# Patient Record
Sex: Male | Born: 1935 | Race: White | Hispanic: No | Marital: Married
Health system: Southern US, Community
[De-identification: ages and names within clinical notes are randomized; demographics above are authoritative.]

## PROBLEM LIST (undated history)

## (undated) DIAGNOSIS — F0390 Unspecified dementia without behavioral disturbance: Secondary | ICD-10-CM

---

## 1998-08-18 ENCOUNTER — Encounter: Payer: Self-pay | Admitting: Gastroenterology

## 1998-08-18 ENCOUNTER — Inpatient Hospital Stay (HOSPITAL_COMMUNITY): Admission: RE | Admit: 1998-08-18 | Discharge: 1998-08-25 | Payer: Self-pay | Admitting: Gastroenterology

## 1999-10-19 ENCOUNTER — Ambulatory Visit (HOSPITAL_COMMUNITY): Admission: RE | Admit: 1999-10-19 | Discharge: 1999-10-19 | Payer: Self-pay | Admitting: Gastroenterology

## 2002-11-13 ENCOUNTER — Encounter (INDEPENDENT_AMBULATORY_CARE_PROVIDER_SITE_OTHER): Payer: Self-pay | Admitting: Specialist

## 2002-11-13 ENCOUNTER — Ambulatory Visit (HOSPITAL_COMMUNITY): Admission: RE | Admit: 2002-11-13 | Discharge: 2002-11-13 | Payer: Self-pay | Admitting: Gastroenterology

## 2010-03-23 ENCOUNTER — Emergency Department (HOSPITAL_BASED_OUTPATIENT_CLINIC_OR_DEPARTMENT_OTHER)
Admission: EM | Admit: 2010-03-23 | Discharge: 2010-03-23 | Payer: Self-pay | Source: Home / Self Care | Admitting: Emergency Medicine

## 2010-08-07 NOTE — Procedures (Signed)
[facility]  Patient:    Frank Romero, Frank Romero                  MRN: [id number] Proc. Date: 10/19/99 Adm. Date:  [id number] Attending:  Louie Bun CC:         Abran Cantor. Clovis Riley, MD             Sheppard Plumber Earlene Plater, M.D.                           Procedure Report  PROCEDURE PERFORMED:  Colonoscopy.  ENDOSCOPIST:  Everardo All. Madilyn Fireman, M.D.  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  DESCRIPTION OF PROCEDURE:  The patient was placed in the left lateral decubitus position and placed on the pulse monitor and continuous low flow oxygen delivered by nasal cannula.  He was sedated with 40 mg IV Demerol and 3 mg IV Versed.  The Olympus video colonoscope was inserted into the rectum and advanced to the ileocolonic anastomosis which was easily identifiable by fibrosis at the anastomotic site.  There was no visible suspicion of neoplasm at the anastomosis and no biopsies were taken.  The colon distal to the anastomosis including the remaining transverse, descending, sigmoid and rectum all appeared normal with no masses, polyps, diverticula or other mucosal abnormalities.  There were some internal and external hemorrhoids seen on withdrawal.  The colonoscope was withdrawn and the patient returned to the recovery room in stable condition.  The patient tolerated the procedure well. There were no immediate complications.  IMPRESSION: 1. Intact anastomosis without evidence of recurrent disease one year after    hemicolectomy for colon cancer. 2. Internal and external hemorrhoids.  PLAN:  Repeat colonoscopy in three years. DD:  10/19/99 TD:  10/20/99 Job: [id number]

## 2010-08-07 NOTE — Op Note (Signed)
   NAME:  Frank Romero, Frank Romero                     ACCOUNT NO.:  [id number]   MEDICAL RECORD NO.:  [id number]                   PATIENT TYPE:  AMB   LOCATION:  ENDO                                 FACILITY:  [facility]   PHYSICIAN:  John C. Madilyn Fireman, M.D.                 DATE OF BIRTH:  Aug 28, 1923   DATE OF PROCEDURE:  11/13/2002  DATE OF DISCHARGE:                                 OPERATIVE REPORT   PROCEDURE:  Colonoscopy polypectomy.   INDICATIONS FOR PROCEDURE:  History of colon cancer with right hemicolectomy  in 2000.  Negative surveillance colonoscopy in 2001.   DESCRIPTION OF PROCEDURE:  The patient was placed in the left lateral  decubitus position and placed on the pulse monitor with continuous low-flow  oxygen delivered by nasal cannula.  He was sedated with 62.5 mcg IV  fentanyl, 5  mg IV Versed.  The Olympus video colonoscope was inserted into  the rectum and advanced to the ileocolonic anastomosis which was easily  identifiable.  There was no visible suspicion of neoplasm there.  The  remaining proximal colon including the visualized portions of the transverse  and descending colon appeared normal with no masses, polyps, diverticula, or  other mucosal abnormalities.  There was a questionable 6 mm polyp in the  sigmoid colon at 30 cm and this was fulgurated by hot biopsy.  The rectum  appeared normal and retroflexed view of the anus revealed no obvious  internal hemorrhoids.  The colonoscope was then withdrawn and the patient  returned to the recovery room in stable condition.  He tolerated the  procedure well and there were no immediate complications.   IMPRESSION:  Questionable small sigmoid polyp.   PLAN:  Await biopsy results and repeat colonoscopy in three to five years.                                                John C. Madilyn Fireman, M.D.    JCH/MEDQ  D:  11/13/2002  T:  11/13/2002  Job:  [id number]   cc:   L. Lupe Carney, M.D.  [address] [place]  [place]  [address]  Fax: [phone]   Sheppard Plumber. Earlene Plater, M.D.  [address] [place]  [place] [address]  Fax: [phone]

## 2013-04-06 DIAGNOSIS — I679 Cerebrovascular disease, unspecified: Secondary | ICD-10-CM | POA: Diagnosis not present

## 2013-04-06 DIAGNOSIS — I10 Essential (primary) hypertension: Secondary | ICD-10-CM | POA: Diagnosis not present

## 2013-04-06 DIAGNOSIS — E7800 Pure hypercholesterolemia, unspecified: Secondary | ICD-10-CM | POA: Diagnosis not present

## 2013-04-17 DIAGNOSIS — R269 Unspecified abnormalities of gait and mobility: Secondary | ICD-10-CM | POA: Diagnosis not present

## 2013-04-17 DIAGNOSIS — I679 Cerebrovascular disease, unspecified: Secondary | ICD-10-CM | POA: Diagnosis not present

## 2013-04-18 DIAGNOSIS — R269 Unspecified abnormalities of gait and mobility: Secondary | ICD-10-CM | POA: Diagnosis not present

## 2013-04-18 DIAGNOSIS — I679 Cerebrovascular disease, unspecified: Secondary | ICD-10-CM | POA: Diagnosis not present

## 2013-04-23 DIAGNOSIS — I679 Cerebrovascular disease, unspecified: Secondary | ICD-10-CM | POA: Diagnosis not present

## 2013-04-23 DIAGNOSIS — R269 Unspecified abnormalities of gait and mobility: Secondary | ICD-10-CM | POA: Diagnosis not present

## 2013-04-25 DIAGNOSIS — R269 Unspecified abnormalities of gait and mobility: Secondary | ICD-10-CM | POA: Diagnosis not present

## 2013-04-25 DIAGNOSIS — I679 Cerebrovascular disease, unspecified: Secondary | ICD-10-CM | POA: Diagnosis not present

## 2013-04-27 DIAGNOSIS — R269 Unspecified abnormalities of gait and mobility: Secondary | ICD-10-CM | POA: Diagnosis not present

## 2013-04-27 DIAGNOSIS — I679 Cerebrovascular disease, unspecified: Secondary | ICD-10-CM | POA: Diagnosis not present

## 2013-04-30 DIAGNOSIS — I679 Cerebrovascular disease, unspecified: Secondary | ICD-10-CM | POA: Diagnosis not present

## 2013-04-30 DIAGNOSIS — R269 Unspecified abnormalities of gait and mobility: Secondary | ICD-10-CM | POA: Diagnosis not present

## 2013-05-02 DIAGNOSIS — R269 Unspecified abnormalities of gait and mobility: Secondary | ICD-10-CM | POA: Diagnosis not present

## 2013-05-02 DIAGNOSIS — I679 Cerebrovascular disease, unspecified: Secondary | ICD-10-CM | POA: Diagnosis not present

## 2013-05-06 DIAGNOSIS — I679 Cerebrovascular disease, unspecified: Secondary | ICD-10-CM | POA: Diagnosis not present

## 2013-05-06 DIAGNOSIS — R269 Unspecified abnormalities of gait and mobility: Secondary | ICD-10-CM | POA: Diagnosis not present

## 2013-05-07 DIAGNOSIS — I679 Cerebrovascular disease, unspecified: Secondary | ICD-10-CM | POA: Diagnosis not present

## 2013-05-07 DIAGNOSIS — R269 Unspecified abnormalities of gait and mobility: Secondary | ICD-10-CM | POA: Diagnosis not present

## 2013-05-09 DIAGNOSIS — R269 Unspecified abnormalities of gait and mobility: Secondary | ICD-10-CM | POA: Diagnosis not present

## 2013-05-09 DIAGNOSIS — I679 Cerebrovascular disease, unspecified: Secondary | ICD-10-CM | POA: Diagnosis not present

## 2013-05-11 DIAGNOSIS — R269 Unspecified abnormalities of gait and mobility: Secondary | ICD-10-CM | POA: Diagnosis not present

## 2013-05-11 DIAGNOSIS — I679 Cerebrovascular disease, unspecified: Secondary | ICD-10-CM | POA: Diagnosis not present

## 2013-06-01 DIAGNOSIS — H4011X0 Primary open-angle glaucoma, stage unspecified: Secondary | ICD-10-CM | POA: Diagnosis not present

## 2013-06-01 DIAGNOSIS — H04129 Dry eye syndrome of unspecified lacrimal gland: Secondary | ICD-10-CM | POA: Diagnosis not present

## 2013-06-01 DIAGNOSIS — H2510 Age-related nuclear cataract, unspecified eye: Secondary | ICD-10-CM | POA: Diagnosis not present

## 2013-10-04 DIAGNOSIS — E7800 Pure hypercholesterolemia, unspecified: Secondary | ICD-10-CM | POA: Diagnosis not present

## 2013-10-04 DIAGNOSIS — I10 Essential (primary) hypertension: Secondary | ICD-10-CM | POA: Diagnosis not present

## 2013-10-04 DIAGNOSIS — Z23 Encounter for immunization: Secondary | ICD-10-CM | POA: Diagnosis not present

## 2013-10-04 DIAGNOSIS — I679 Cerebrovascular disease, unspecified: Secondary | ICD-10-CM | POA: Diagnosis not present

## 2013-10-11 DIAGNOSIS — H04129 Dry eye syndrome of unspecified lacrimal gland: Secondary | ICD-10-CM | POA: Diagnosis not present

## 2013-10-11 DIAGNOSIS — H4011X0 Primary open-angle glaucoma, stage unspecified: Secondary | ICD-10-CM | POA: Diagnosis not present

## 2013-10-11 DIAGNOSIS — H01029 Squamous blepharitis unspecified eye, unspecified eyelid: Secondary | ICD-10-CM | POA: Diagnosis not present

## 2013-10-11 DIAGNOSIS — H2510 Age-related nuclear cataract, unspecified eye: Secondary | ICD-10-CM | POA: Diagnosis not present

## 2013-12-20 DIAGNOSIS — Z23 Encounter for immunization: Secondary | ICD-10-CM | POA: Diagnosis not present

## 2014-02-07 DIAGNOSIS — H01025 Squamous blepharitis left lower eyelid: Secondary | ICD-10-CM | POA: Diagnosis not present

## 2014-02-07 DIAGNOSIS — H01021 Squamous blepharitis right upper eyelid: Secondary | ICD-10-CM | POA: Diagnosis not present

## 2014-02-07 DIAGNOSIS — H01024 Squamous blepharitis left upper eyelid: Secondary | ICD-10-CM | POA: Diagnosis not present

## 2014-02-07 DIAGNOSIS — H01022 Squamous blepharitis right lower eyelid: Secondary | ICD-10-CM | POA: Diagnosis not present

## 2014-02-07 DIAGNOSIS — H4011X3 Primary open-angle glaucoma, severe stage: Secondary | ICD-10-CM | POA: Diagnosis not present

## 2014-02-07 DIAGNOSIS — H2513 Age-related nuclear cataract, bilateral: Secondary | ICD-10-CM | POA: Diagnosis not present

## 2014-04-09 DIAGNOSIS — I679 Cerebrovascular disease, unspecified: Secondary | ICD-10-CM | POA: Diagnosis not present

## 2014-04-09 DIAGNOSIS — E780 Pure hypercholesterolemia: Secondary | ICD-10-CM | POA: Diagnosis not present

## 2014-04-09 DIAGNOSIS — Z85038 Personal history of other malignant neoplasm of large intestine: Secondary | ICD-10-CM | POA: Diagnosis not present

## 2014-04-09 DIAGNOSIS — K649 Unspecified hemorrhoids: Secondary | ICD-10-CM | POA: Diagnosis not present

## 2014-04-09 DIAGNOSIS — R413 Other amnesia: Secondary | ICD-10-CM | POA: Diagnosis not present

## 2014-04-09 DIAGNOSIS — I10 Essential (primary) hypertension: Secondary | ICD-10-CM | POA: Diagnosis not present

## 2014-06-11 DIAGNOSIS — H4011X3 Primary open-angle glaucoma, severe stage: Secondary | ICD-10-CM | POA: Diagnosis not present

## 2014-06-11 DIAGNOSIS — H2513 Age-related nuclear cataract, bilateral: Secondary | ICD-10-CM | POA: Diagnosis not present

## 2014-06-11 DIAGNOSIS — H5703 Miosis: Secondary | ICD-10-CM | POA: Diagnosis not present

## 2014-06-11 DIAGNOSIS — H4011X1 Primary open-angle glaucoma, mild stage: Secondary | ICD-10-CM | POA: Diagnosis not present

## 2014-07-01 DIAGNOSIS — H5703 Miosis: Secondary | ICD-10-CM | POA: Diagnosis not present

## 2014-07-01 DIAGNOSIS — H268 Other specified cataract: Secondary | ICD-10-CM | POA: Diagnosis not present

## 2014-07-01 DIAGNOSIS — H2512 Age-related nuclear cataract, left eye: Secondary | ICD-10-CM | POA: Diagnosis not present

## 2014-07-01 DIAGNOSIS — H21562 Pupillary abnormality, left eye: Secondary | ICD-10-CM | POA: Diagnosis not present

## 2014-07-01 DIAGNOSIS — H409 Unspecified glaucoma: Secondary | ICD-10-CM | POA: Diagnosis not present

## 2014-10-03 DIAGNOSIS — E780 Pure hypercholesterolemia: Secondary | ICD-10-CM | POA: Diagnosis not present

## 2014-10-03 DIAGNOSIS — I679 Cerebrovascular disease, unspecified: Secondary | ICD-10-CM | POA: Diagnosis not present

## 2014-10-03 DIAGNOSIS — R413 Other amnesia: Secondary | ICD-10-CM | POA: Diagnosis not present

## 2014-10-03 DIAGNOSIS — I10 Essential (primary) hypertension: Secondary | ICD-10-CM | POA: Diagnosis not present

## 2014-11-07 DIAGNOSIS — H4011X1 Primary open-angle glaucoma, mild stage: Secondary | ICD-10-CM | POA: Diagnosis not present

## 2014-11-07 DIAGNOSIS — H4011X3 Primary open-angle glaucoma, severe stage: Secondary | ICD-10-CM | POA: Diagnosis not present

## 2014-11-07 DIAGNOSIS — Z961 Presence of intraocular lens: Secondary | ICD-10-CM | POA: Diagnosis not present

## 2014-11-07 DIAGNOSIS — H25811 Combined forms of age-related cataract, right eye: Secondary | ICD-10-CM | POA: Diagnosis not present

## 2015-03-18 DIAGNOSIS — Z961 Presence of intraocular lens: Secondary | ICD-10-CM | POA: Diagnosis not present

## 2015-03-18 DIAGNOSIS — H401111 Primary open-angle glaucoma, right eye, mild stage: Secondary | ICD-10-CM | POA: Diagnosis not present

## 2015-03-18 DIAGNOSIS — H25811 Combined forms of age-related cataract, right eye: Secondary | ICD-10-CM | POA: Diagnosis not present

## 2015-03-18 DIAGNOSIS — H401123 Primary open-angle glaucoma, left eye, severe stage: Secondary | ICD-10-CM | POA: Diagnosis not present

## 2015-04-08 DIAGNOSIS — E7800 Pure hypercholesterolemia, unspecified: Secondary | ICD-10-CM | POA: Diagnosis not present

## 2015-04-08 DIAGNOSIS — I679 Cerebrovascular disease, unspecified: Secondary | ICD-10-CM | POA: Diagnosis not present

## 2015-04-08 DIAGNOSIS — I10 Essential (primary) hypertension: Secondary | ICD-10-CM | POA: Diagnosis not present

## 2015-04-08 DIAGNOSIS — R413 Other amnesia: Secondary | ICD-10-CM | POA: Diagnosis not present

## 2015-06-05 DIAGNOSIS — H25811 Combined forms of age-related cataract, right eye: Secondary | ICD-10-CM | POA: Diagnosis not present

## 2015-06-05 DIAGNOSIS — Z961 Presence of intraocular lens: Secondary | ICD-10-CM | POA: Diagnosis not present

## 2015-06-05 DIAGNOSIS — H401123 Primary open-angle glaucoma, left eye, severe stage: Secondary | ICD-10-CM | POA: Diagnosis not present

## 2015-06-05 DIAGNOSIS — H401111 Primary open-angle glaucoma, right eye, mild stage: Secondary | ICD-10-CM | POA: Diagnosis not present

## 2015-10-07 DIAGNOSIS — R413 Other amnesia: Secondary | ICD-10-CM | POA: Diagnosis not present

## 2015-10-07 DIAGNOSIS — I10 Essential (primary) hypertension: Secondary | ICD-10-CM | POA: Diagnosis not present

## 2015-10-07 DIAGNOSIS — E7800 Pure hypercholesterolemia, unspecified: Secondary | ICD-10-CM | POA: Diagnosis not present

## 2015-10-07 DIAGNOSIS — D489 Neoplasm of uncertain behavior, unspecified: Secondary | ICD-10-CM | POA: Diagnosis not present

## 2015-10-07 DIAGNOSIS — I679 Cerebrovascular disease, unspecified: Secondary | ICD-10-CM | POA: Diagnosis not present

## 2015-10-07 DIAGNOSIS — K649 Unspecified hemorrhoids: Secondary | ICD-10-CM | POA: Diagnosis not present

## 2015-10-07 DIAGNOSIS — D485 Neoplasm of uncertain behavior of skin: Secondary | ICD-10-CM | POA: Diagnosis not present

## 2015-10-09 DIAGNOSIS — H25811 Combined forms of age-related cataract, right eye: Secondary | ICD-10-CM | POA: Diagnosis not present

## 2015-10-09 DIAGNOSIS — H401111 Primary open-angle glaucoma, right eye, mild stage: Secondary | ICD-10-CM | POA: Diagnosis not present

## 2015-10-09 DIAGNOSIS — H401123 Primary open-angle glaucoma, left eye, severe stage: Secondary | ICD-10-CM | POA: Diagnosis not present

## 2015-10-09 DIAGNOSIS — Z961 Presence of intraocular lens: Secondary | ICD-10-CM | POA: Diagnosis not present

## 2015-11-21 DIAGNOSIS — H2511 Age-related nuclear cataract, right eye: Secondary | ICD-10-CM | POA: Diagnosis not present

## 2015-12-16 DIAGNOSIS — Z23 Encounter for immunization: Secondary | ICD-10-CM | POA: Diagnosis not present

## 2016-01-05 DIAGNOSIS — H2511 Age-related nuclear cataract, right eye: Secondary | ICD-10-CM | POA: Diagnosis not present

## 2016-01-05 DIAGNOSIS — H401112 Primary open-angle glaucoma, right eye, moderate stage: Secondary | ICD-10-CM | POA: Diagnosis not present

## 2016-01-05 DIAGNOSIS — H401111 Primary open-angle glaucoma, right eye, mild stage: Secondary | ICD-10-CM | POA: Diagnosis not present

## 2016-01-05 DIAGNOSIS — H25011 Cortical age-related cataract, right eye: Secondary | ICD-10-CM | POA: Diagnosis not present

## 2016-01-05 DIAGNOSIS — H25041 Posterior subcapsular polar age-related cataract, right eye: Secondary | ICD-10-CM | POA: Diagnosis not present

## 2016-05-07 DIAGNOSIS — R413 Other amnesia: Secondary | ICD-10-CM | POA: Diagnosis not present

## 2016-05-07 DIAGNOSIS — E7800 Pure hypercholesterolemia, unspecified: Secondary | ICD-10-CM | POA: Diagnosis not present

## 2016-05-07 DIAGNOSIS — I10 Essential (primary) hypertension: Secondary | ICD-10-CM | POA: Diagnosis not present

## 2016-05-07 DIAGNOSIS — I679 Cerebrovascular disease, unspecified: Secondary | ICD-10-CM | POA: Diagnosis not present

## 2016-07-01 DIAGNOSIS — H25811 Combined forms of age-related cataract, right eye: Secondary | ICD-10-CM | POA: Diagnosis not present

## 2016-07-01 DIAGNOSIS — Z961 Presence of intraocular lens: Secondary | ICD-10-CM | POA: Diagnosis not present

## 2016-07-01 DIAGNOSIS — H401111 Primary open-angle glaucoma, right eye, mild stage: Secondary | ICD-10-CM | POA: Diagnosis not present

## 2016-07-01 DIAGNOSIS — H401123 Primary open-angle glaucoma, left eye, severe stage: Secondary | ICD-10-CM | POA: Diagnosis not present

## 2016-10-29 DIAGNOSIS — H401123 Primary open-angle glaucoma, left eye, severe stage: Secondary | ICD-10-CM | POA: Diagnosis not present

## 2016-10-29 DIAGNOSIS — H401111 Primary open-angle glaucoma, right eye, mild stage: Secondary | ICD-10-CM | POA: Diagnosis not present

## 2016-11-09 DIAGNOSIS — I679 Cerebrovascular disease, unspecified: Secondary | ICD-10-CM | POA: Diagnosis not present

## 2016-11-09 DIAGNOSIS — I10 Essential (primary) hypertension: Secondary | ICD-10-CM | POA: Diagnosis not present

## 2016-11-09 DIAGNOSIS — E7800 Pure hypercholesterolemia, unspecified: Secondary | ICD-10-CM | POA: Diagnosis not present

## 2016-11-09 DIAGNOSIS — K649 Unspecified hemorrhoids: Secondary | ICD-10-CM | POA: Diagnosis not present

## 2016-11-09 DIAGNOSIS — R413 Other amnesia: Secondary | ICD-10-CM | POA: Diagnosis not present

## 2016-11-19 DIAGNOSIS — H401123 Primary open-angle glaucoma, left eye, severe stage: Secondary | ICD-10-CM | POA: Diagnosis not present

## 2016-11-19 DIAGNOSIS — H401111 Primary open-angle glaucoma, right eye, mild stage: Secondary | ICD-10-CM | POA: Diagnosis not present

## 2016-12-22 DIAGNOSIS — Z23 Encounter for immunization: Secondary | ICD-10-CM | POA: Diagnosis not present

## 2017-03-31 DIAGNOSIS — H401112 Primary open-angle glaucoma, right eye, moderate stage: Secondary | ICD-10-CM | POA: Diagnosis not present

## 2017-03-31 DIAGNOSIS — Z961 Presence of intraocular lens: Secondary | ICD-10-CM | POA: Diagnosis not present

## 2017-03-31 DIAGNOSIS — H401123 Primary open-angle glaucoma, left eye, severe stage: Secondary | ICD-10-CM | POA: Diagnosis not present

## 2017-05-17 DIAGNOSIS — E7800 Pure hypercholesterolemia, unspecified: Secondary | ICD-10-CM | POA: Diagnosis not present

## 2017-05-17 DIAGNOSIS — R413 Other amnesia: Secondary | ICD-10-CM | POA: Diagnosis not present

## 2017-05-17 DIAGNOSIS — I10 Essential (primary) hypertension: Secondary | ICD-10-CM | POA: Diagnosis not present

## 2017-05-17 DIAGNOSIS — I679 Cerebrovascular disease, unspecified: Secondary | ICD-10-CM | POA: Diagnosis not present

## 2017-06-07 DIAGNOSIS — H401112 Primary open-angle glaucoma, right eye, moderate stage: Secondary | ICD-10-CM | POA: Diagnosis not present

## 2017-06-07 DIAGNOSIS — H401123 Primary open-angle glaucoma, left eye, severe stage: Secondary | ICD-10-CM | POA: Diagnosis not present

## 2017-11-15 DIAGNOSIS — I10 Essential (primary) hypertension: Secondary | ICD-10-CM | POA: Diagnosis not present

## 2017-11-15 DIAGNOSIS — R413 Other amnesia: Secondary | ICD-10-CM | POA: Diagnosis not present

## 2017-11-15 DIAGNOSIS — I679 Cerebrovascular disease, unspecified: Secondary | ICD-10-CM | POA: Diagnosis not present

## 2017-11-15 DIAGNOSIS — E7800 Pure hypercholesterolemia, unspecified: Secondary | ICD-10-CM | POA: Diagnosis not present

## 2017-12-13 DIAGNOSIS — H401111 Primary open-angle glaucoma, right eye, mild stage: Secondary | ICD-10-CM | POA: Diagnosis not present

## 2017-12-13 DIAGNOSIS — H401123 Primary open-angle glaucoma, left eye, severe stage: Secondary | ICD-10-CM | POA: Diagnosis not present

## 2017-12-15 DIAGNOSIS — Z23 Encounter for immunization: Secondary | ICD-10-CM | POA: Diagnosis not present

## 2018-05-24 DIAGNOSIS — E7800 Pure hypercholesterolemia, unspecified: Secondary | ICD-10-CM | POA: Diagnosis not present

## 2018-05-24 DIAGNOSIS — I10 Essential (primary) hypertension: Secondary | ICD-10-CM | POA: Diagnosis not present

## 2018-05-24 DIAGNOSIS — I679 Cerebrovascular disease, unspecified: Secondary | ICD-10-CM | POA: Diagnosis not present

## 2018-05-24 DIAGNOSIS — K649 Unspecified hemorrhoids: Secondary | ICD-10-CM | POA: Diagnosis not present

## 2018-05-24 DIAGNOSIS — R413 Other amnesia: Secondary | ICD-10-CM | POA: Diagnosis not present

## 2018-07-13 DIAGNOSIS — H4010X0 Unspecified open-angle glaucoma, stage unspecified: Secondary | ICD-10-CM | POA: Diagnosis not present

## 2018-07-13 DIAGNOSIS — I10 Essential (primary) hypertension: Secondary | ICD-10-CM | POA: Diagnosis not present

## 2018-07-13 DIAGNOSIS — E785 Hyperlipidemia, unspecified: Secondary | ICD-10-CM | POA: Diagnosis not present

## 2018-07-18 DIAGNOSIS — I10 Essential (primary) hypertension: Secondary | ICD-10-CM | POA: Diagnosis not present

## 2018-07-18 DIAGNOSIS — H409 Unspecified glaucoma: Secondary | ICD-10-CM | POA: Diagnosis not present

## 2018-07-18 DIAGNOSIS — E785 Hyperlipidemia, unspecified: Secondary | ICD-10-CM | POA: Diagnosis not present

## 2018-07-18 DIAGNOSIS — F0390 Unspecified dementia without behavioral disturbance: Secondary | ICD-10-CM | POA: Diagnosis not present

## 2018-08-22 DIAGNOSIS — W182XXA Fall in (into) shower or empty bathtub, initial encounter: Secondary | ICD-10-CM | POA: Diagnosis not present

## 2018-08-22 DIAGNOSIS — S0083XA Contusion of other part of head, initial encounter: Secondary | ICD-10-CM | POA: Diagnosis not present

## 2018-09-12 DIAGNOSIS — R2681 Unsteadiness on feet: Secondary | ICD-10-CM | POA: Diagnosis not present

## 2018-09-12 DIAGNOSIS — Z9181 History of falling: Secondary | ICD-10-CM | POA: Diagnosis not present

## 2018-09-12 DIAGNOSIS — I69398 Other sequelae of cerebral infarction: Secondary | ICD-10-CM | POA: Diagnosis not present

## 2018-09-13 DIAGNOSIS — Z9181 History of falling: Secondary | ICD-10-CM | POA: Diagnosis not present

## 2018-09-13 DIAGNOSIS — R2681 Unsteadiness on feet: Secondary | ICD-10-CM | POA: Diagnosis not present

## 2018-09-13 DIAGNOSIS — I69398 Other sequelae of cerebral infarction: Secondary | ICD-10-CM | POA: Diagnosis not present

## 2018-09-18 DIAGNOSIS — I69398 Other sequelae of cerebral infarction: Secondary | ICD-10-CM | POA: Diagnosis not present

## 2018-09-18 DIAGNOSIS — Z9181 History of falling: Secondary | ICD-10-CM | POA: Diagnosis not present

## 2018-09-18 DIAGNOSIS — R2681 Unsteadiness on feet: Secondary | ICD-10-CM | POA: Diagnosis not present

## 2018-09-20 DIAGNOSIS — R2681 Unsteadiness on feet: Secondary | ICD-10-CM | POA: Diagnosis not present

## 2018-09-20 DIAGNOSIS — Z9181 History of falling: Secondary | ICD-10-CM | POA: Diagnosis not present

## 2018-09-20 DIAGNOSIS — I69398 Other sequelae of cerebral infarction: Secondary | ICD-10-CM | POA: Diagnosis not present

## 2018-09-21 DIAGNOSIS — I69398 Other sequelae of cerebral infarction: Secondary | ICD-10-CM | POA: Diagnosis not present

## 2018-09-21 DIAGNOSIS — R2681 Unsteadiness on feet: Secondary | ICD-10-CM | POA: Diagnosis not present

## 2018-09-21 DIAGNOSIS — Z9181 History of falling: Secondary | ICD-10-CM | POA: Diagnosis not present

## 2018-09-25 DIAGNOSIS — I69398 Other sequelae of cerebral infarction: Secondary | ICD-10-CM | POA: Diagnosis not present

## 2018-09-25 DIAGNOSIS — Z9181 History of falling: Secondary | ICD-10-CM | POA: Diagnosis not present

## 2018-09-25 DIAGNOSIS — R2681 Unsteadiness on feet: Secondary | ICD-10-CM | POA: Diagnosis not present

## 2018-09-27 DIAGNOSIS — R2681 Unsteadiness on feet: Secondary | ICD-10-CM | POA: Diagnosis not present

## 2018-09-27 DIAGNOSIS — I69398 Other sequelae of cerebral infarction: Secondary | ICD-10-CM | POA: Diagnosis not present

## 2018-09-27 DIAGNOSIS — Z9181 History of falling: Secondary | ICD-10-CM | POA: Diagnosis not present

## 2018-09-28 DIAGNOSIS — I69398 Other sequelae of cerebral infarction: Secondary | ICD-10-CM | POA: Diagnosis not present

## 2018-09-28 DIAGNOSIS — Z9181 History of falling: Secondary | ICD-10-CM | POA: Diagnosis not present

## 2018-09-28 DIAGNOSIS — R2681 Unsteadiness on feet: Secondary | ICD-10-CM | POA: Diagnosis not present

## 2018-10-02 DIAGNOSIS — I69398 Other sequelae of cerebral infarction: Secondary | ICD-10-CM | POA: Diagnosis not present

## 2018-10-02 DIAGNOSIS — Z9181 History of falling: Secondary | ICD-10-CM | POA: Diagnosis not present

## 2018-10-02 DIAGNOSIS — R2681 Unsteadiness on feet: Secondary | ICD-10-CM | POA: Diagnosis not present

## 2018-10-04 DIAGNOSIS — Z9181 History of falling: Secondary | ICD-10-CM | POA: Diagnosis not present

## 2018-10-04 DIAGNOSIS — I69398 Other sequelae of cerebral infarction: Secondary | ICD-10-CM | POA: Diagnosis not present

## 2018-10-04 DIAGNOSIS — R2681 Unsteadiness on feet: Secondary | ICD-10-CM | POA: Diagnosis not present

## 2018-10-05 DIAGNOSIS — R2681 Unsteadiness on feet: Secondary | ICD-10-CM | POA: Diagnosis not present

## 2018-10-05 DIAGNOSIS — I69398 Other sequelae of cerebral infarction: Secondary | ICD-10-CM | POA: Diagnosis not present

## 2018-10-05 DIAGNOSIS — Z9181 History of falling: Secondary | ICD-10-CM | POA: Diagnosis not present

## 2018-10-09 DIAGNOSIS — Z9181 History of falling: Secondary | ICD-10-CM | POA: Diagnosis not present

## 2018-10-09 DIAGNOSIS — I69398 Other sequelae of cerebral infarction: Secondary | ICD-10-CM | POA: Diagnosis not present

## 2018-10-09 DIAGNOSIS — R2681 Unsteadiness on feet: Secondary | ICD-10-CM | POA: Diagnosis not present

## 2018-10-11 DIAGNOSIS — Z9181 History of falling: Secondary | ICD-10-CM | POA: Diagnosis not present

## 2018-10-11 DIAGNOSIS — I69398 Other sequelae of cerebral infarction: Secondary | ICD-10-CM | POA: Diagnosis not present

## 2018-10-11 DIAGNOSIS — R2681 Unsteadiness on feet: Secondary | ICD-10-CM | POA: Diagnosis not present

## 2018-10-12 DIAGNOSIS — Z9181 History of falling: Secondary | ICD-10-CM | POA: Diagnosis not present

## 2018-10-12 DIAGNOSIS — I69398 Other sequelae of cerebral infarction: Secondary | ICD-10-CM | POA: Diagnosis not present

## 2018-10-12 DIAGNOSIS — R2681 Unsteadiness on feet: Secondary | ICD-10-CM | POA: Diagnosis not present

## 2018-10-16 DIAGNOSIS — R2681 Unsteadiness on feet: Secondary | ICD-10-CM | POA: Diagnosis not present

## 2018-10-16 DIAGNOSIS — I69398 Other sequelae of cerebral infarction: Secondary | ICD-10-CM | POA: Diagnosis not present

## 2018-10-16 DIAGNOSIS — Z9181 History of falling: Secondary | ICD-10-CM | POA: Diagnosis not present

## 2018-11-02 DIAGNOSIS — Z961 Presence of intraocular lens: Secondary | ICD-10-CM | POA: Diagnosis not present

## 2018-11-02 DIAGNOSIS — H401123 Primary open-angle glaucoma, left eye, severe stage: Secondary | ICD-10-CM | POA: Diagnosis not present

## 2018-11-02 DIAGNOSIS — H401111 Primary open-angle glaucoma, right eye, mild stage: Secondary | ICD-10-CM | POA: Diagnosis not present

## 2018-11-29 DIAGNOSIS — E7800 Pure hypercholesterolemia, unspecified: Secondary | ICD-10-CM | POA: Diagnosis not present

## 2018-11-29 DIAGNOSIS — K649 Unspecified hemorrhoids: Secondary | ICD-10-CM | POA: Diagnosis not present

## 2018-11-29 DIAGNOSIS — R413 Other amnesia: Secondary | ICD-10-CM | POA: Diagnosis not present

## 2018-11-29 DIAGNOSIS — I679 Cerebrovascular disease, unspecified: Secondary | ICD-10-CM | POA: Diagnosis not present

## 2018-11-29 DIAGNOSIS — I10 Essential (primary) hypertension: Secondary | ICD-10-CM | POA: Diagnosis not present

## 2019-04-04 DIAGNOSIS — Z23 Encounter for immunization: Secondary | ICD-10-CM | POA: Diagnosis not present

## 2019-04-18 DIAGNOSIS — H26492 Other secondary cataract, left eye: Secondary | ICD-10-CM | POA: Diagnosis not present

## 2019-05-01 DIAGNOSIS — Z23 Encounter for immunization: Secondary | ICD-10-CM | POA: Diagnosis not present

## 2019-06-06 DIAGNOSIS — R413 Other amnesia: Secondary | ICD-10-CM | POA: Diagnosis not present

## 2019-06-06 DIAGNOSIS — E7800 Pure hypercholesterolemia, unspecified: Secondary | ICD-10-CM | POA: Diagnosis not present

## 2019-06-06 DIAGNOSIS — I679 Cerebrovascular disease, unspecified: Secondary | ICD-10-CM | POA: Diagnosis not present

## 2019-06-06 DIAGNOSIS — K649 Unspecified hemorrhoids: Secondary | ICD-10-CM | POA: Diagnosis not present

## 2019-06-06 DIAGNOSIS — I10 Essential (primary) hypertension: Secondary | ICD-10-CM | POA: Diagnosis not present

## 2019-06-08 DIAGNOSIS — H401111 Primary open-angle glaucoma, right eye, mild stage: Secondary | ICD-10-CM | POA: Diagnosis not present

## 2019-06-08 DIAGNOSIS — H401123 Primary open-angle glaucoma, left eye, severe stage: Secondary | ICD-10-CM | POA: Diagnosis not present

## 2019-06-08 DIAGNOSIS — Z961 Presence of intraocular lens: Secondary | ICD-10-CM | POA: Diagnosis not present

## 2019-07-19 DIAGNOSIS — M6281 Muscle weakness (generalized): Secondary | ICD-10-CM | POA: Diagnosis not present

## 2019-07-19 DIAGNOSIS — F0390 Unspecified dementia without behavioral disturbance: Secondary | ICD-10-CM | POA: Diagnosis not present

## 2019-07-24 DIAGNOSIS — M6281 Muscle weakness (generalized): Secondary | ICD-10-CM | POA: Diagnosis not present

## 2019-07-24 DIAGNOSIS — F0390 Unspecified dementia without behavioral disturbance: Secondary | ICD-10-CM | POA: Diagnosis not present

## 2019-07-25 DIAGNOSIS — Z7689 Persons encountering health services in other specified circumstances: Secondary | ICD-10-CM | POA: Diagnosis not present

## 2019-07-25 DIAGNOSIS — M25511 Pain in right shoulder: Secondary | ICD-10-CM | POA: Diagnosis not present

## 2019-07-26 DIAGNOSIS — F0390 Unspecified dementia without behavioral disturbance: Secondary | ICD-10-CM | POA: Diagnosis not present

## 2019-07-26 DIAGNOSIS — M6281 Muscle weakness (generalized): Secondary | ICD-10-CM | POA: Diagnosis not present

## 2019-08-02 DIAGNOSIS — F0390 Unspecified dementia without behavioral disturbance: Secondary | ICD-10-CM | POA: Diagnosis not present

## 2019-08-02 DIAGNOSIS — M6281 Muscle weakness (generalized): Secondary | ICD-10-CM | POA: Diagnosis not present

## 2019-08-03 DIAGNOSIS — F0390 Unspecified dementia without behavioral disturbance: Secondary | ICD-10-CM | POA: Diagnosis not present

## 2019-08-03 DIAGNOSIS — M6281 Muscle weakness (generalized): Secondary | ICD-10-CM | POA: Diagnosis not present

## 2019-08-08 DIAGNOSIS — M6281 Muscle weakness (generalized): Secondary | ICD-10-CM | POA: Diagnosis not present

## 2019-08-08 DIAGNOSIS — F0390 Unspecified dementia without behavioral disturbance: Secondary | ICD-10-CM | POA: Diagnosis not present

## 2019-08-14 DIAGNOSIS — M6281 Muscle weakness (generalized): Secondary | ICD-10-CM | POA: Diagnosis not present

## 2019-08-14 DIAGNOSIS — F0390 Unspecified dementia without behavioral disturbance: Secondary | ICD-10-CM | POA: Diagnosis not present

## 2019-08-16 DIAGNOSIS — F0390 Unspecified dementia without behavioral disturbance: Secondary | ICD-10-CM | POA: Diagnosis not present

## 2019-08-16 DIAGNOSIS — M6281 Muscle weakness (generalized): Secondary | ICD-10-CM | POA: Diagnosis not present

## 2019-08-30 DIAGNOSIS — I10 Essential (primary) hypertension: Secondary | ICD-10-CM | POA: Diagnosis not present

## 2019-08-30 DIAGNOSIS — E785 Hyperlipidemia, unspecified: Secondary | ICD-10-CM | POA: Diagnosis not present

## 2019-08-30 DIAGNOSIS — G301 Alzheimer's disease with late onset: Secondary | ICD-10-CM | POA: Diagnosis not present

## 2019-08-30 DIAGNOSIS — F0280 Dementia in other diseases classified elsewhere without behavioral disturbance: Secondary | ICD-10-CM | POA: Diagnosis not present

## 2019-08-31 DIAGNOSIS — F0390 Unspecified dementia without behavioral disturbance: Secondary | ICD-10-CM | POA: Diagnosis not present

## 2019-08-31 DIAGNOSIS — W19XXXA Unspecified fall, initial encounter: Secondary | ICD-10-CM | POA: Diagnosis not present

## 2019-09-13 DIAGNOSIS — I69398 Other sequelae of cerebral infarction: Secondary | ICD-10-CM | POA: Diagnosis not present

## 2019-09-13 DIAGNOSIS — F0390 Unspecified dementia without behavioral disturbance: Secondary | ICD-10-CM | POA: Diagnosis not present

## 2019-09-13 DIAGNOSIS — M6281 Muscle weakness (generalized): Secondary | ICD-10-CM | POA: Diagnosis not present

## 2019-09-13 DIAGNOSIS — Z9181 History of falling: Secondary | ICD-10-CM | POA: Diagnosis not present

## 2019-09-13 DIAGNOSIS — E785 Hyperlipidemia, unspecified: Secondary | ICD-10-CM | POA: Diagnosis not present

## 2019-09-13 DIAGNOSIS — I10 Essential (primary) hypertension: Secondary | ICD-10-CM | POA: Diagnosis not present

## 2019-10-14 DIAGNOSIS — F0390 Unspecified dementia without behavioral disturbance: Secondary | ICD-10-CM | POA: Diagnosis not present

## 2019-10-14 DIAGNOSIS — N1832 Chronic kidney disease, stage 3b: Secondary | ICD-10-CM | POA: Diagnosis not present

## 2019-10-14 DIAGNOSIS — I69398 Other sequelae of cerebral infarction: Secondary | ICD-10-CM | POA: Diagnosis not present

## 2019-10-14 DIAGNOSIS — G609 Hereditary and idiopathic neuropathy, unspecified: Secondary | ICD-10-CM | POA: Diagnosis not present

## 2019-10-14 DIAGNOSIS — I10 Essential (primary) hypertension: Secondary | ICD-10-CM | POA: Diagnosis not present

## 2019-10-14 DIAGNOSIS — M810 Age-related osteoporosis without current pathological fracture: Secondary | ICD-10-CM | POA: Diagnosis not present

## 2019-10-14 DIAGNOSIS — E785 Hyperlipidemia, unspecified: Secondary | ICD-10-CM | POA: Diagnosis not present

## 2019-10-16 DIAGNOSIS — F0280 Dementia in other diseases classified elsewhere without behavioral disturbance: Secondary | ICD-10-CM | POA: Diagnosis not present

## 2019-10-16 DIAGNOSIS — G309 Alzheimer's disease, unspecified: Secondary | ICD-10-CM | POA: Diagnosis not present

## 2019-10-16 DIAGNOSIS — I10 Essential (primary) hypertension: Secondary | ICD-10-CM | POA: Diagnosis not present

## 2019-10-16 DIAGNOSIS — E785 Hyperlipidemia, unspecified: Secondary | ICD-10-CM | POA: Diagnosis not present

## 2019-11-04 DIAGNOSIS — Z1159 Encounter for screening for other viral diseases: Secondary | ICD-10-CM | POA: Diagnosis not present

## 2019-11-08 DIAGNOSIS — E785 Hyperlipidemia, unspecified: Secondary | ICD-10-CM | POA: Diagnosis not present

## 2019-11-08 DIAGNOSIS — F0280 Dementia in other diseases classified elsewhere without behavioral disturbance: Secondary | ICD-10-CM | POA: Diagnosis not present

## 2019-11-08 DIAGNOSIS — I10 Essential (primary) hypertension: Secondary | ICD-10-CM | POA: Diagnosis not present

## 2019-11-08 DIAGNOSIS — G301 Alzheimer's disease with late onset: Secondary | ICD-10-CM | POA: Diagnosis not present

## 2019-12-18 DIAGNOSIS — R21 Rash and other nonspecific skin eruption: Secondary | ICD-10-CM | POA: Diagnosis not present

## 2019-12-18 DIAGNOSIS — H6123 Impacted cerumen, bilateral: Secondary | ICD-10-CM | POA: Diagnosis not present

## 2020-01-23 DIAGNOSIS — R296 Repeated falls: Secondary | ICD-10-CM | POA: Diagnosis not present

## 2020-01-23 DIAGNOSIS — Z9181 History of falling: Secondary | ICD-10-CM | POA: Diagnosis not present

## 2020-01-23 DIAGNOSIS — R2681 Unsteadiness on feet: Secondary | ICD-10-CM | POA: Diagnosis not present

## 2020-01-23 DIAGNOSIS — R278 Other lack of coordination: Secondary | ICD-10-CM | POA: Diagnosis not present

## 2020-01-23 DIAGNOSIS — Z23 Encounter for immunization: Secondary | ICD-10-CM | POA: Diagnosis not present

## 2020-01-23 DIAGNOSIS — F0390 Unspecified dementia without behavioral disturbance: Secondary | ICD-10-CM | POA: Diagnosis not present

## 2020-01-24 DIAGNOSIS — R296 Repeated falls: Secondary | ICD-10-CM | POA: Diagnosis not present

## 2020-01-24 DIAGNOSIS — Z9181 History of falling: Secondary | ICD-10-CM | POA: Diagnosis not present

## 2020-01-24 DIAGNOSIS — R278 Other lack of coordination: Secondary | ICD-10-CM | POA: Diagnosis not present

## 2020-01-24 DIAGNOSIS — F0390 Unspecified dementia without behavioral disturbance: Secondary | ICD-10-CM | POA: Diagnosis not present

## 2020-01-24 DIAGNOSIS — R2681 Unsteadiness on feet: Secondary | ICD-10-CM | POA: Diagnosis not present

## 2020-01-28 DIAGNOSIS — H401111 Primary open-angle glaucoma, right eye, mild stage: Secondary | ICD-10-CM | POA: Diagnosis not present

## 2020-01-28 DIAGNOSIS — Z961 Presence of intraocular lens: Secondary | ICD-10-CM | POA: Diagnosis not present

## 2020-01-28 DIAGNOSIS — H401123 Primary open-angle glaucoma, left eye, severe stage: Secondary | ICD-10-CM | POA: Diagnosis not present

## 2020-01-28 DIAGNOSIS — S50812A Abrasion of left forearm, initial encounter: Secondary | ICD-10-CM | POA: Diagnosis not present

## 2020-01-28 DIAGNOSIS — H26491 Other secondary cataract, right eye: Secondary | ICD-10-CM | POA: Diagnosis not present

## 2020-01-28 DIAGNOSIS — H353130 Nonexudative age-related macular degeneration, bilateral, stage unspecified: Secondary | ICD-10-CM | POA: Diagnosis not present

## 2020-01-30 DIAGNOSIS — F0390 Unspecified dementia without behavioral disturbance: Secondary | ICD-10-CM | POA: Diagnosis not present

## 2020-01-30 DIAGNOSIS — Z9181 History of falling: Secondary | ICD-10-CM | POA: Diagnosis not present

## 2020-01-30 DIAGNOSIS — R278 Other lack of coordination: Secondary | ICD-10-CM | POA: Diagnosis not present

## 2020-01-30 DIAGNOSIS — R2681 Unsteadiness on feet: Secondary | ICD-10-CM | POA: Diagnosis not present

## 2020-01-30 DIAGNOSIS — R296 Repeated falls: Secondary | ICD-10-CM | POA: Diagnosis not present

## 2020-01-31 DIAGNOSIS — R278 Other lack of coordination: Secondary | ICD-10-CM | POA: Diagnosis not present

## 2020-01-31 DIAGNOSIS — F0390 Unspecified dementia without behavioral disturbance: Secondary | ICD-10-CM | POA: Diagnosis not present

## 2020-01-31 DIAGNOSIS — R296 Repeated falls: Secondary | ICD-10-CM | POA: Diagnosis not present

## 2020-01-31 DIAGNOSIS — R2681 Unsteadiness on feet: Secondary | ICD-10-CM | POA: Diagnosis not present

## 2020-01-31 DIAGNOSIS — Z9181 History of falling: Secondary | ICD-10-CM | POA: Diagnosis not present

## 2020-02-04 DIAGNOSIS — R278 Other lack of coordination: Secondary | ICD-10-CM | POA: Diagnosis not present

## 2020-02-04 DIAGNOSIS — Z9181 History of falling: Secondary | ICD-10-CM | POA: Diagnosis not present

## 2020-02-04 DIAGNOSIS — R2681 Unsteadiness on feet: Secondary | ICD-10-CM | POA: Diagnosis not present

## 2020-02-04 DIAGNOSIS — F0390 Unspecified dementia without behavioral disturbance: Secondary | ICD-10-CM | POA: Diagnosis not present

## 2020-02-04 DIAGNOSIS — R296 Repeated falls: Secondary | ICD-10-CM | POA: Diagnosis not present

## 2020-02-05 DIAGNOSIS — F0390 Unspecified dementia without behavioral disturbance: Secondary | ICD-10-CM | POA: Diagnosis not present

## 2020-02-05 DIAGNOSIS — R2681 Unsteadiness on feet: Secondary | ICD-10-CM | POA: Diagnosis not present

## 2020-02-05 DIAGNOSIS — R296 Repeated falls: Secondary | ICD-10-CM | POA: Diagnosis not present

## 2020-02-05 DIAGNOSIS — R278 Other lack of coordination: Secondary | ICD-10-CM | POA: Diagnosis not present

## 2020-02-05 DIAGNOSIS — Z9181 History of falling: Secondary | ICD-10-CM | POA: Diagnosis not present

## 2020-02-06 DIAGNOSIS — R296 Repeated falls: Secondary | ICD-10-CM | POA: Diagnosis not present

## 2020-02-06 DIAGNOSIS — R278 Other lack of coordination: Secondary | ICD-10-CM | POA: Diagnosis not present

## 2020-02-06 DIAGNOSIS — F0390 Unspecified dementia without behavioral disturbance: Secondary | ICD-10-CM | POA: Diagnosis not present

## 2020-02-06 DIAGNOSIS — R2681 Unsteadiness on feet: Secondary | ICD-10-CM | POA: Diagnosis not present

## 2020-02-06 DIAGNOSIS — Z9181 History of falling: Secondary | ICD-10-CM | POA: Diagnosis not present

## 2020-02-07 DIAGNOSIS — F0390 Unspecified dementia without behavioral disturbance: Secondary | ICD-10-CM | POA: Diagnosis not present

## 2020-02-07 DIAGNOSIS — R296 Repeated falls: Secondary | ICD-10-CM | POA: Diagnosis not present

## 2020-02-07 DIAGNOSIS — R2681 Unsteadiness on feet: Secondary | ICD-10-CM | POA: Diagnosis not present

## 2020-02-07 DIAGNOSIS — Z9181 History of falling: Secondary | ICD-10-CM | POA: Diagnosis not present

## 2020-02-07 DIAGNOSIS — R278 Other lack of coordination: Secondary | ICD-10-CM | POA: Diagnosis not present

## 2020-02-11 DIAGNOSIS — F0390 Unspecified dementia without behavioral disturbance: Secondary | ICD-10-CM | POA: Diagnosis not present

## 2020-02-11 DIAGNOSIS — H6122 Impacted cerumen, left ear: Secondary | ICD-10-CM | POA: Diagnosis not present

## 2020-02-11 DIAGNOSIS — Z9181 History of falling: Secondary | ICD-10-CM | POA: Diagnosis not present

## 2020-02-11 DIAGNOSIS — R2681 Unsteadiness on feet: Secondary | ICD-10-CM | POA: Diagnosis not present

## 2020-02-11 DIAGNOSIS — R278 Other lack of coordination: Secondary | ICD-10-CM | POA: Diagnosis not present

## 2020-02-11 DIAGNOSIS — R296 Repeated falls: Secondary | ICD-10-CM | POA: Diagnosis not present

## 2020-02-13 DIAGNOSIS — F0390 Unspecified dementia without behavioral disturbance: Secondary | ICD-10-CM | POA: Diagnosis not present

## 2020-02-13 DIAGNOSIS — R278 Other lack of coordination: Secondary | ICD-10-CM | POA: Diagnosis not present

## 2020-02-13 DIAGNOSIS — R296 Repeated falls: Secondary | ICD-10-CM | POA: Diagnosis not present

## 2020-02-13 DIAGNOSIS — Z9181 History of falling: Secondary | ICD-10-CM | POA: Diagnosis not present

## 2020-02-13 DIAGNOSIS — R2681 Unsteadiness on feet: Secondary | ICD-10-CM | POA: Diagnosis not present

## 2020-02-16 DIAGNOSIS — R296 Repeated falls: Secondary | ICD-10-CM | POA: Diagnosis not present

## 2020-02-16 DIAGNOSIS — R278 Other lack of coordination: Secondary | ICD-10-CM | POA: Diagnosis not present

## 2020-02-16 DIAGNOSIS — R2681 Unsteadiness on feet: Secondary | ICD-10-CM | POA: Diagnosis not present

## 2020-02-16 DIAGNOSIS — F0390 Unspecified dementia without behavioral disturbance: Secondary | ICD-10-CM | POA: Diagnosis not present

## 2020-02-16 DIAGNOSIS — Z9181 History of falling: Secondary | ICD-10-CM | POA: Diagnosis not present

## 2020-02-18 DIAGNOSIS — R296 Repeated falls: Secondary | ICD-10-CM | POA: Diagnosis not present

## 2020-02-18 DIAGNOSIS — R278 Other lack of coordination: Secondary | ICD-10-CM | POA: Diagnosis not present

## 2020-02-18 DIAGNOSIS — F0390 Unspecified dementia without behavioral disturbance: Secondary | ICD-10-CM | POA: Diagnosis not present

## 2020-02-18 DIAGNOSIS — R2681 Unsteadiness on feet: Secondary | ICD-10-CM | POA: Diagnosis not present

## 2020-02-18 DIAGNOSIS — Z9181 History of falling: Secondary | ICD-10-CM | POA: Diagnosis not present

## 2020-02-19 DIAGNOSIS — R278 Other lack of coordination: Secondary | ICD-10-CM | POA: Diagnosis not present

## 2020-02-19 DIAGNOSIS — Z9181 History of falling: Secondary | ICD-10-CM | POA: Diagnosis not present

## 2020-02-19 DIAGNOSIS — R2681 Unsteadiness on feet: Secondary | ICD-10-CM | POA: Diagnosis not present

## 2020-02-19 DIAGNOSIS — R296 Repeated falls: Secondary | ICD-10-CM | POA: Diagnosis not present

## 2020-02-19 DIAGNOSIS — F0390 Unspecified dementia without behavioral disturbance: Secondary | ICD-10-CM | POA: Diagnosis not present

## 2020-02-21 DIAGNOSIS — R2681 Unsteadiness on feet: Secondary | ICD-10-CM | POA: Diagnosis not present

## 2020-02-21 DIAGNOSIS — Z9181 History of falling: Secondary | ICD-10-CM | POA: Diagnosis not present

## 2020-02-21 DIAGNOSIS — R296 Repeated falls: Secondary | ICD-10-CM | POA: Diagnosis not present

## 2020-02-21 DIAGNOSIS — R278 Other lack of coordination: Secondary | ICD-10-CM | POA: Diagnosis not present

## 2020-02-21 DIAGNOSIS — F0390 Unspecified dementia without behavioral disturbance: Secondary | ICD-10-CM | POA: Diagnosis not present

## 2020-11-26 ENCOUNTER — Encounter (HOSPITAL_COMMUNITY): Payer: Self-pay

## 2020-11-26 ENCOUNTER — Emergency Department (HOSPITAL_COMMUNITY): Payer: Medicare Other

## 2020-11-26 ENCOUNTER — Emergency Department (HOSPITAL_COMMUNITY)
Admission: EM | Admit: 2020-11-26 | Discharge: 2020-11-26 | Disposition: A | Discharge status: Skilled Nursing Facility | Payer: Medicare Other | Attending: Emergency Medicine | Admitting: Emergency Medicine

## 2020-11-26 DIAGNOSIS — Y92129 Unspecified place in nursing home as the place of occurrence of the external cause: Secondary | ICD-10-CM | POA: Insufficient documentation

## 2020-11-26 DIAGNOSIS — F0390 Unspecified dementia without behavioral disturbance: Secondary | ICD-10-CM | POA: Diagnosis not present

## 2020-11-26 DIAGNOSIS — W19XXXA Unspecified fall, initial encounter: Secondary | ICD-10-CM | POA: Insufficient documentation

## 2020-11-26 DIAGNOSIS — M25552 Pain in left hip: Secondary | ICD-10-CM | POA: Diagnosis present

## 2020-11-26 HISTORY — DX: Unspecified dementia, unspecified severity, without behavioral disturbance, psychotic disturbance, mood disturbance, and anxiety: F03.90

## 2020-11-26 NOTE — ED Provider Notes (Signed)
[facility] DEPT Provider Note  CSN: [id number] Arrival date & time: 11/26/20 0403  Chief Complaint(s) Fall ED Triage Notes Frank Puffer, RN (Registered Nurse)   Emergency Medicine   Date of Service: 11/26/2020  4:14 AM   Signed   Patient arrives via EMS from [facility] after unwitnessed fall. Staff entered pt's room at 0235 and found pt on floor. Pt complains of left hip pain, no deformity noted. Pt was able to stand and sit on stretcher per EMS. No blood thinners or reported LOC. Hx of dementia     HPI Frank Romero is a 87 y.o. male here for possible unwitnessed fall. Initially complained of left hip pain. Was able to ambulate with EMS. Currently has no complaints.  Remainder of history, ROS, and physical exam limited due to patient's condition (dementia). Additional information was obtained from EMS.   Level V Caveat.    Fall   Past Medical History Past Medical History:  Diagnosis Date   Dementia ([place])    There are no problems to display for this patient.  Home Medication(s) Prior to Admission medications   Not on File                                                                                                                                    Past Surgical History ** The histories are not reviewed yet. Please review them in the "History" navigator section and refresh this [place]. Family History No family history on file.  Social History Social History   Tobacco Use   Smoking status: Unknown   Allergies Sulfamethoxazole  Review of Systems Review of Systems Unable to obtain due to dementia Physical Exam Vital Signs  I have reviewed the triage vital signs BP 131/73   Pulse 62   Temp 97.8 F (36.6 C) (Oral)   Resp 18   SpO2 96%   Physical Exam Constitutional:      General: He is not in acute distress.    Appearance: He is well-developed. He is not diaphoretic.  HENT:     Head:  Normocephalic and atraumatic.     Right Ear: External ear normal.     Left Ear: External ear normal.  Eyes:     General: No scleral icterus.       Right eye: No discharge.        Left eye: No discharge.     Conjunctiva/sclera: Conjunctivae normal.     Pupils: Pupils are equal, round, and reactive to light.  Cardiovascular:     Rate and Rhythm: Regular rhythm.     Pulses:          Radial pulses are 2+ on the right side and 2+ on the left side.       Dorsalis pedis pulses are 2+ on the right side and 2+ on the left side.     Heart  sounds: Normal heart sounds. No murmur heard.   No friction rub. No gallop.  Pulmonary:     Effort: Pulmonary effort is normal. No respiratory distress.     Breath sounds: Normal breath sounds. No stridor.  Abdominal:     General: There is no distension.     Palpations: Abdomen is soft.     Tenderness: There is no abdominal tenderness.  Musculoskeletal:     Cervical back: Normal range of motion and neck supple. No bony tenderness.     Thoracic back: No bony tenderness.     Lumbar back: No bony tenderness.     Right hip: No deformity, tenderness or bony tenderness. Normal range of motion. Normal strength.     Left hip: No deformity, tenderness or bony tenderness. Normal range of motion. Normal strength.     Comments: Clavicle stable. Chest stable to AP/Lat compression. Pelvis stable to Lat compression. No obvious extremity deformity. No chest or abdominal wall contusion.  Skin:    General: Skin is warm.  Neurological:     Mental Status: He is alert and oriented to person, place, and time.     GCS: GCS eye subscore is 4. GCS verbal subscore is 5. GCS motor subscore is 6.     Comments: Moving all extremities     ED Results and Treatments Labs (all labs ordered are listed, but only abnormal results are displayed) Labs Reviewed - No data to display                                                                                                                        EKG  EKG Interpretation  Date/Time:    Ventricular Rate:    PR Interval:    QRS Duration:   QT Interval:    QTC Calculation:   R Axis:     Text Interpretation:         Radiology DG HIP UNILAT W OR W/O PELVIS 2-3 VIEWS LEFT  Result Date: 11/26/2020 CLINICAL DATA:  87 year old male found down.  Left hip pain. EXAM: DG HIP (WITH OR WITHOUT PELVIS) 2-3V LEFT COMPARISON:  None. FINDINGS: Bone mineralization is within normal limits for age. Femoral heads are normally located. The pelvis appears intact. SI joints appear symmetric. Grossly intact proximal right femur. Intact proximal left femur. No acute osseous abnormality identified. Negative visible lower abdominal and pelvic visceral contours. Partially visible lumbar scoliosis. IMPRESSION: No acute fracture or dislocation identified about the left hip or pelvis. Electronically Signed   By: Genevie Ann M.D.   On: 11/26/2020 06:30    Pertinent labs & imaging results that were available during my care of the patient were reviewed by me and considered in my medical decision making (see MDM for details).  Medications Ordered in ED Medications - No data to display  Procedures Procedures  (including critical care time)  Medical Decision Making / ED Course I have reviewed the nursing notes for this encounter and the patient's prior records (if available in EHR or on provided paperwork).  Frank Romero was evaluated in Emergency Department on 11/26/2020 for the symptoms described in the history of present illness. He was evaluated in the context of the global COVID-19 pandemic, which necessitated consideration that the patient might be at risk for infection with the SARS-CoV-2 virus that causes COVID-19. Institutional protocols and algorithms that pertain to the evaluation of patients at risk for COVID-19  are in a state of rapid change based on information released by regulatory bodies including the CDC and federal and state organizations. These policies and algorithms were followed during the patient's care in the ED.     Found on the floor at [facility]. Possible unwitnessed fall. Initially complaining of left hip pain but currently has no complaints. No signs of trauma noted on exam.  Fully ranged all joints without obvious signs of discomfort. Will obtain a plain film of the left hip to rule out fracture. No head trauma requiring CT head..  Pertinent labs & imaging results that were available during my care of the patient were reviewed by me and considered in my medical decision making:  Plain film negative  Final Clinical Impression(s) / ED Diagnoses Final diagnoses:  Fall at nursing home, initial encounter   The patient appears reasonably screened and/or stabilized for discharge and I doubt any other medical condition or other [facility] requiring further screening, evaluation, or treatment in the ED at this time prior to discharge. Safe for discharge with strict return precautions.  Disposition: Discharge  Condition: Good   ED Discharge Orders     None        Follow Up: Javier Glazier, MD [place] [phone]  Call  as needed    This chart was dictated using voice recognition software.  Despite best efforts to proofread,  errors can occur which can change the documentation meaning.    Fatima Blank, MD 11/26/20 [phone]

## 2020-11-26 NOTE — ED Notes (Signed)
Patient able to remove all equipment and get out of bed. Patient was found exiting room and was redirected back to bed. Patient was able to ambulate without assistance and free of pain.

## 2020-11-26 NOTE — ED Triage Notes (Signed)
Patient arrives via EMS from [facility] after unwitnessed fall. Staff entered pt's room at 0235 and found pt on floor. Pt complains of left hip pain, no deformity noted. Pt was able to stand and sit on stretcher per EMS. No blood thinners or reported LOC. Hx of dementia  BP 122/84 HR 54 RR 18  SPO2 93% CBG 92

## 2020-11-26 NOTE — ED Notes (Signed)
[facility] contacted, transport arranged for patient back to facility.

## 2021-05-20 ENCOUNTER — Other Ambulatory Visit (HOSPITAL_COMMUNITY): Payer: Self-pay

## 2021-05-20 DIAGNOSIS — R1310 Dysphagia, unspecified: Secondary | ICD-10-CM

## 2021-05-28 ENCOUNTER — Ambulatory Visit (HOSPITAL_COMMUNITY)
Admission: RE | Admit: 2021-05-28 | Discharge: 2021-05-28 | Disposition: A | Discharge status: Home / Self Care | Payer: Medicare Other | Source: Ambulatory Visit | Attending: Internal Medicine | Admitting: Internal Medicine

## 2021-05-28 ENCOUNTER — Encounter (HOSPITAL_COMMUNITY): Payer: Self-pay

## 2021-05-28 ENCOUNTER — Other Ambulatory Visit: Payer: Self-pay

## 2021-05-28 DIAGNOSIS — R1312 Dysphagia, oropharyngeal phase: Secondary | ICD-10-CM | POA: Insufficient documentation

## 2021-05-28 DIAGNOSIS — R1310 Dysphagia, unspecified: Secondary | ICD-10-CM | POA: Diagnosis present

## 2021-05-28 NOTE — Evaluation (Signed)
Objective Swallowing Evaluation: Type of Study: MBS-Modified Barium Swallow Study   Patient Details  Name: Frank Romero MRN: [id number] Date of Birth: Nov 19, 1923  Today's Date: 05/28/2021 Time: SLP Start Time (ACUTE ONLY): 1108 -SLP Stop Time (ACUTE ONLY): 1158  SLP Time Calculation (min) (ACUTE ONLY): 50 min   Past Medical History:  Past Medical History:  Diagnosis Date   Dementia ([place])    Past Surgical History: History reviewed. No pertinent surgical history. HPI: Pt is a 88 y.o. male who presents for OP MBS from nursing home per treating SLP recommendation in order to determine potential for diet advancement. Unable to locate record, however son present for MBS reports that pt was recently hospitalized in January 2023 and had a previous MBS during that time. He has been consuming chopped diet with NTL (no straws) at nursing home since then. PMH: HTN, hyperlipidemia, other sequelae of cerebral infarction, glaucoma,  cognitive communication deficit, alzheimer's disease and oropharyngeal dysphagia.   No data recorded   Recommendations for follow up therapy are one component of a multi-disciplinary discharge planning process, led by the attending physician.  Recommendations may be updated based on patient status, additional functional criteria and insurance authorization.  Assessment / Plan / Recommendation  Clinical Impressions 05/28/2021  Clinical Impression Pt presents with oropharyngeal dysphagia marked by poor bolus cohesion, premature spillage, delayed swallow initation, reduced epiglottic inversion and incomplete laryngeal vestibule closure resulting in aspiration of thin and NTL via straw with pt not able to eject with spontaneous cough (PAS 7). Chin tuck with use of straw did not eliminate aspiration with thin liquids. Residuals noted in vallecular space with POs, increasing with thicker consistencies and textures. In x1 instance, residuals in pyriforms spilled into airway  contacting cords (PAS 5) post NTL via straw. Cued throat clear cleared material from vocal cords, but pt unable to fully eject from airway and it eventually spilled below the cords and sensed (PAS 7). Throughout fluoroscopy, cued repeat effortful dry swallows and sips of NTL via cup eliminated majority of pharyngeal residuals. Significantly prolonged mastication and oral transit noted with regular texture and pt required bite of puree/sip of NTL to assist in bolus formation and oral clearance. Pill with NTL hung in cervical esophagus and PA, present for study, suspects an element of esophageal dysmotility. No aspiration observed with pill taken with NTL. Recommend dys 2 (ground/minced) diet with nectar thick liquids (no straw). Pt would benefit from repeat dry/effortful swallows, alternation of bites with sips and intermittent throat clear/re-swallow to reduce risk for aspiration of pharyngeal residuals. He may be ok for mechanical soft solid items, however will defer this to primary treating SLP for trials at bedside.  SLP Visit Diagnosis Dysphagia, oropharyngeal phase (R13.12)  Attention and concentration deficit following --  Frontal lobe and executive function deficit following --  Impact on safety and function Moderate aspiration risk;Severe aspiration risk      Treatment Recommendations 05/28/2021  Treatment Recommendations Defer treatment plan to f/u with SLP     Prognosis 05/28/2021  Prognosis for Safe Diet Advancement Fair  Barriers to Reach Goals Severity of deficits;Time post onset;Cognitive deficits  Barriers/Prognosis Comment --    Diet Recommendations 05/28/2021  SLP Diet Recommendations Dysphagia 2 (Fine chop) solids;Nectar thick liquid  Liquid Administration via Cup;No straw  Medication Administration Whole meds with liquid  Compensations Minimize environmental distractions;Slow rate;Small sips/bites;Multiple dry swallows after each bite/sip;Follow solids with liquid;Clear throat  intermittently;Effortful swallow  Postural Changes Remain semi-upright after after feeds/meals (Comment)  Other Recommendations 05/28/2021  Recommended Consults --  Oral Care Recommendations Oral care BID;Staff/trained caregiver to provide oral care  Other Recommendations --  Follow Up Recommendations Other (comment)  Assistance recommended at discharge Frequent or constant Supervision/Assistance  Functional Status Assessment --    No flowsheet data found.    Oral Phase 05/28/2021  Oral Phase Impaired  Oral - Pudding Teaspoon --  Oral - Pudding Cup --  Oral - Honey Teaspoon --  Oral - Honey Cup Decreased bolus cohesion;Lingual pumping  Oral - Nectar Teaspoon --  Oral - Nectar Cup Decreased bolus cohesion;Lingual pumping;Premature spillage  Oral - Nectar Straw Decreased bolus cohesion;Lingual pumping;Premature spillage  Oral - Thin Teaspoon --  Oral - Thin Cup Decreased bolus cohesion;Lingual pumping;Premature spillage  Oral - Thin Straw Decreased bolus cohesion;Lingual pumping;Premature spillage  Oral - Puree Decreased bolus cohesion;Lingual pumping;Impaired mastication;Weak lingual manipulation  Oral - Mech Soft --  Oral - Regular Decreased bolus cohesion;Lingual pumping;Impaired mastication;Weak lingual manipulation;Delayed oral transit  Oral - Multi-Consistency --  Oral - Pill Decreased bolus cohesion;Weak lingual manipulation  Oral Phase - Comment --    Pharyngeal Phase 05/28/2021  Pharyngeal Phase Impaired  Pharyngeal- Pudding Teaspoon --  Pharyngeal --  Pharyngeal- Pudding Cup --  Pharyngeal --  Pharyngeal- Honey Teaspoon --  Pharyngeal --  Pharyngeal- Honey Cup Pharyngeal residue - valleculae  Pharyngeal --  Pharyngeal- Nectar Teaspoon --  Pharyngeal --  Pharyngeal- Nectar Cup Pharyngeal residue - valleculae;Lateral channel residue;Penetration/Apiration after swallow;Reduced airway/laryngeal closure;Reduced epiglottic inversion;Delayed swallow  initiation-vallecula;Reduced laryngeal elevation;Reduced anterior laryngeal mobility  Pharyngeal Material enters airway, passes BELOW cords then ejected out;Material enters airway, CONTACTS cords and not ejected out  Pharyngeal- Nectar Straw Pharyngeal residue - valleculae;Lateral channel residue;Penetration/Apiration after swallow;Reduced airway/laryngeal closure;Reduced epiglottic inversion;Delayed swallow initiation-vallecula;Reduced laryngeal elevation;Penetration/Aspiration during swallow;Trace aspiration;Reduced anterior laryngeal mobility  Pharyngeal Material enters airway, passes BELOW cords then ejected out;Material enters airway, passes BELOW cords and not ejected out despite cough attempt by patient  Pharyngeal- Thin Teaspoon (No Data)  Pharyngeal --  Pharyngeal- Thin Cup Pharyngeal residue - valleculae;Lateral channel residue;Reduced airway/laryngeal closure;Reduced epiglottic inversion;Reduced laryngeal elevation;Delayed swallow initiation-pyriform sinuses;Reduced anterior laryngeal mobility;Penetration/Aspiration before swallow;Moderate aspiration  Pharyngeal Material enters airway, passes BELOW cords and not ejected out despite cough attempt by patient  Pharyngeal- Thin Straw Pharyngeal residue - valleculae;Lateral channel residue;Reduced airway/laryngeal closure;Reduced epiglottic inversion;Reduced laryngeal elevation;Delayed swallow initiation-pyriform sinuses;Reduced anterior laryngeal mobility;Penetration/Aspiration before swallow;Moderate aspiration;Compensatory strategies attempted (with notebox);Other (Comment)  Pharyngeal Material enters airway, passes BELOW cords and not ejected out despite cough attempt by patient  Pharyngeal- Puree Pharyngeal residue - valleculae;Reduced epiglottic inversion;Reduced anterior laryngeal mobility;Reduced laryngeal elevation  Pharyngeal --  Pharyngeal- Mechanical Soft --  Pharyngeal --  Pharyngeal- Regular Pharyngeal residue - valleculae;Reduced  epiglottic inversion;Reduced anterior laryngeal mobility;Reduced laryngeal elevation  Pharyngeal --  Pharyngeal- Multi-consistency --  Pharyngeal --  Pharyngeal- Pill WFL  Pharyngeal --  Pharyngeal Comment --     Cervical Esophageal Phase  05/28/2021  Cervical Esophageal Phase Impaired  Pudding Teaspoon --  Pudding Cup --  Honey Teaspoon --  Honey Cup --  Nectar Teaspoon --  Nectar Cup --  Nectar Straw --  Thin Teaspoon --  Thin Cup --  Thin Straw --  Puree --  Mechanical Soft --  Regular --  Multi-consistency --  Pill Other (Comment)  Cervical Esophageal Comment --      Ellwood Dense, MA, [place] Office Number: [phone]  Acie Fredrickson 05/28/2021, 1:44 PM

## 2021-06-20 DEATH — deceased

## 2022-03-12 IMAGING — RF DG SWALLOWING FUNCTION
12 of 24 series · 12 of 24 positions shown · non-contrast
Comparison: Modified barium swallow 04/07/2021.

CLINICAL DATA: Dysphagia, unspecified type. Additional history
provided: Difficulty swallowing.

EXAM:
MODIFIED BARIUM SWALLOW
TECHNIQUE: Different consistencies of barium were administered orally to the
patient by the Speech Pathologist. Imaging of the pharynx was
performed in the lateral projection. [place], Tl was present
in the fluoroscopy suite and operated the fluoroscopy equipment.
FLUOROSCOPY:
Fluoroscopy Time:  7 minutes, 24 seconds
Radiation Exposure Index (if provided by the fluoroscopic device):
39.72

[Series 2: run · 1 of 23 frames shown (1 of 12)]
[frame 1/23]
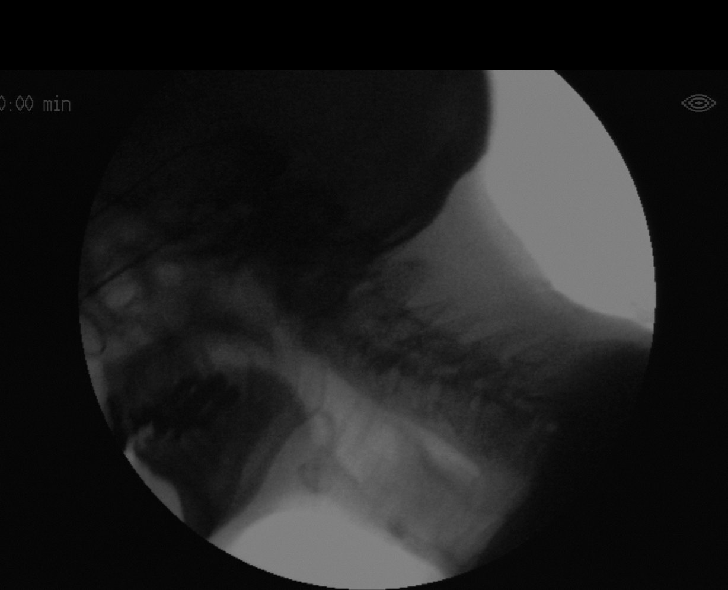

[Series 4: run · 1 of 24 frames shown (2 of 12)]
[frame 4/24]
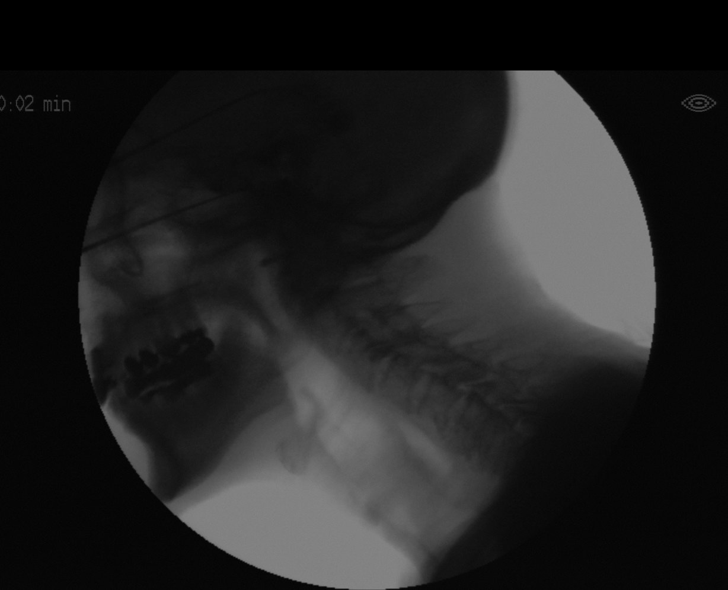

[Series 6: run · 1 of 157 frames shown (3 of 12)]
[frame 79/157]
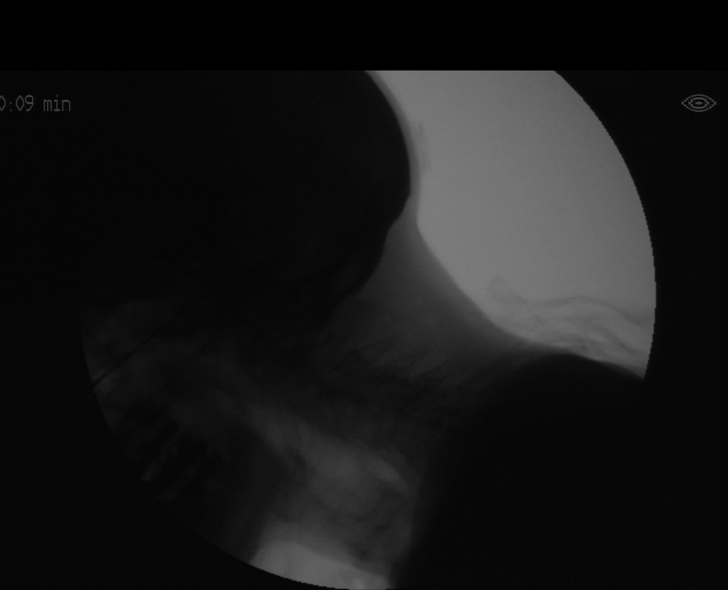

[Series 8: run · 1 of 239 frames shown (4 of 12)]
[frame 36/239]
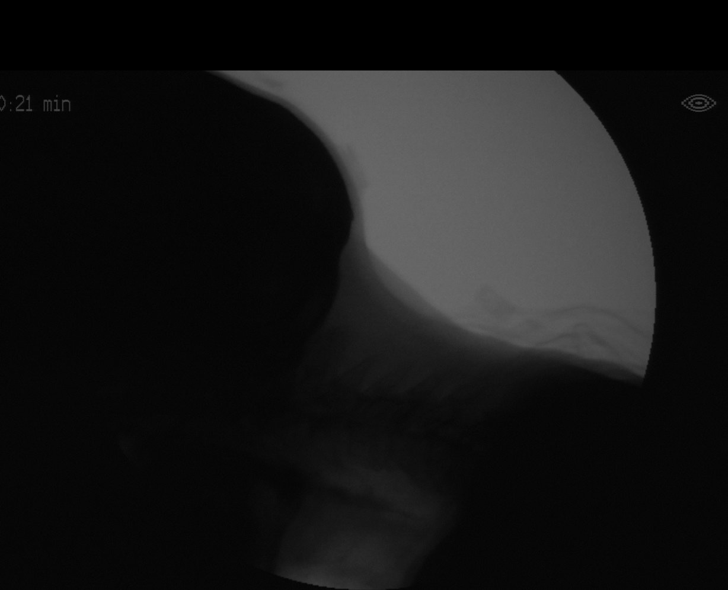

[Series 10: run · 1 of 546 frames shown (5 of 12)]
[frame 82/546]
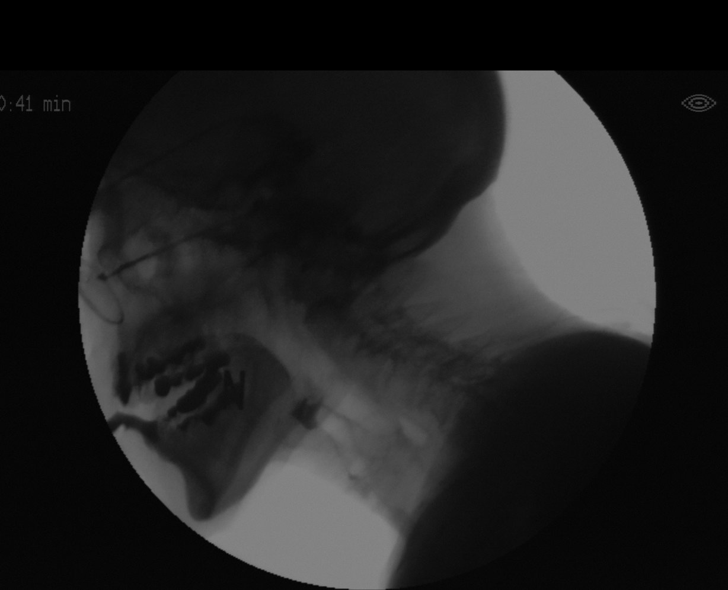

[Series 12: run · 1 of 417 frames shown (6 of 12)]
[frame 63/417]
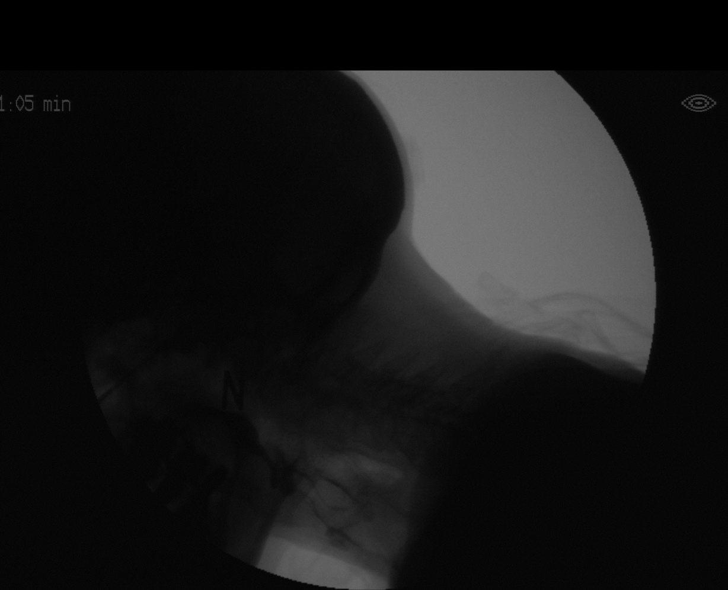

[Series 14: run · 1 of 398 frames shown (7 of 12)]
[frame 200/398]
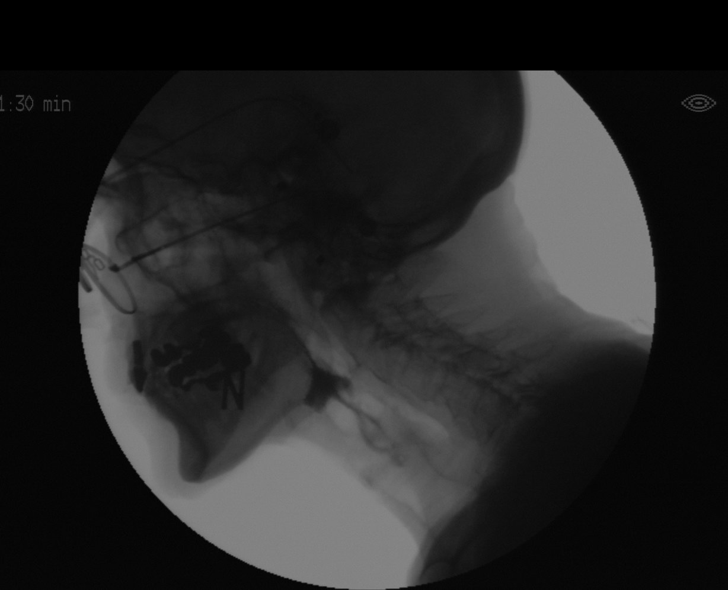

[Series 16: run · 1 of 156 frames shown (8 of 12)]
[frame 79/156]
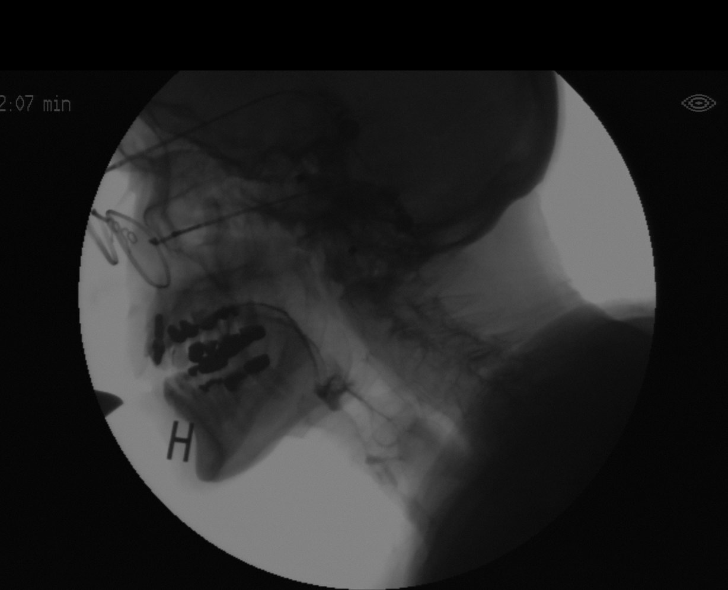

[Series 18: run · 1 of 29 frames shown (9 of 12)]
[frame 18/29]
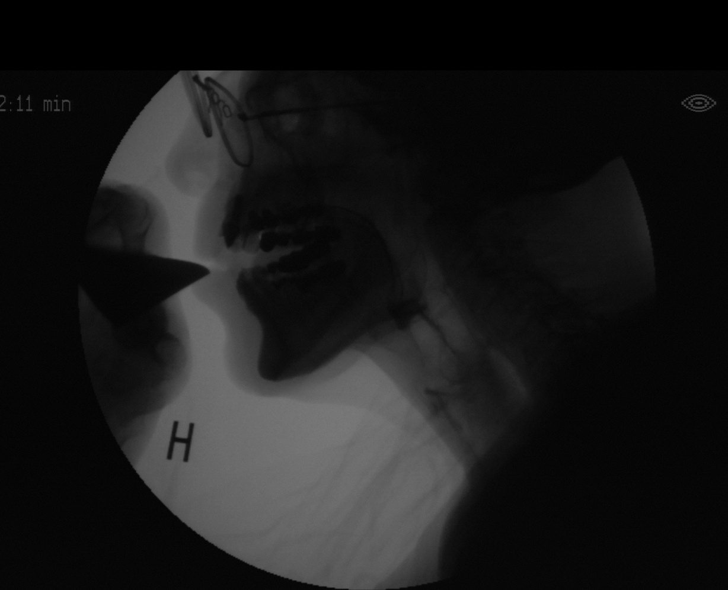

[Series 20: run · 1 of 754 frames shown (10 of 12)]
[frame 619/754]
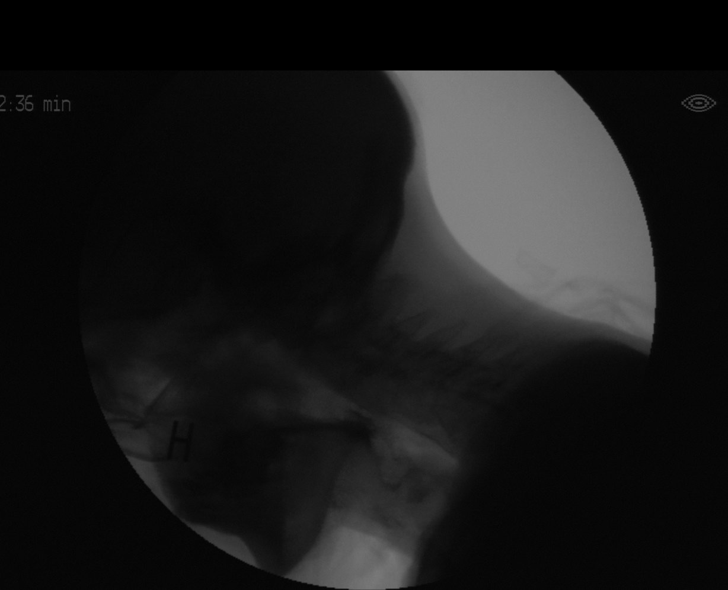

[Series 22: run · 1 of 96 frames shown (11 of 12)]
[frame 82/96]
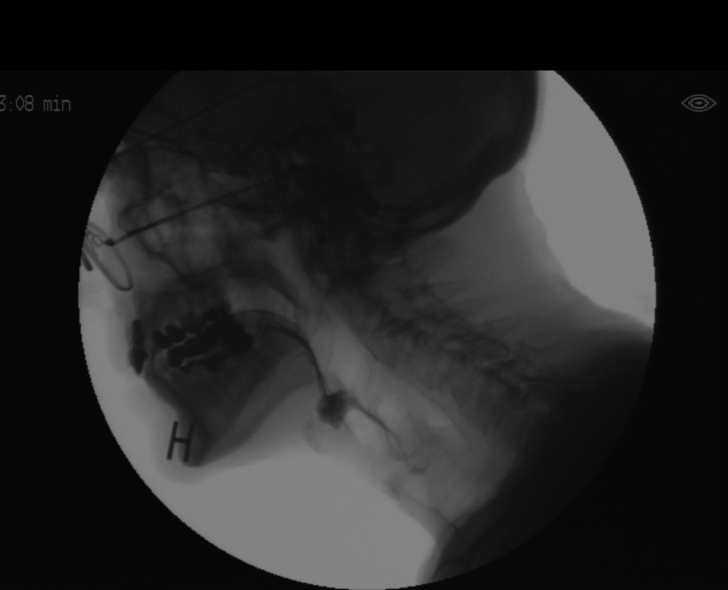

[Series 24: run · 1 of 108 frames shown (12 of 12)]
[frame 92/108]
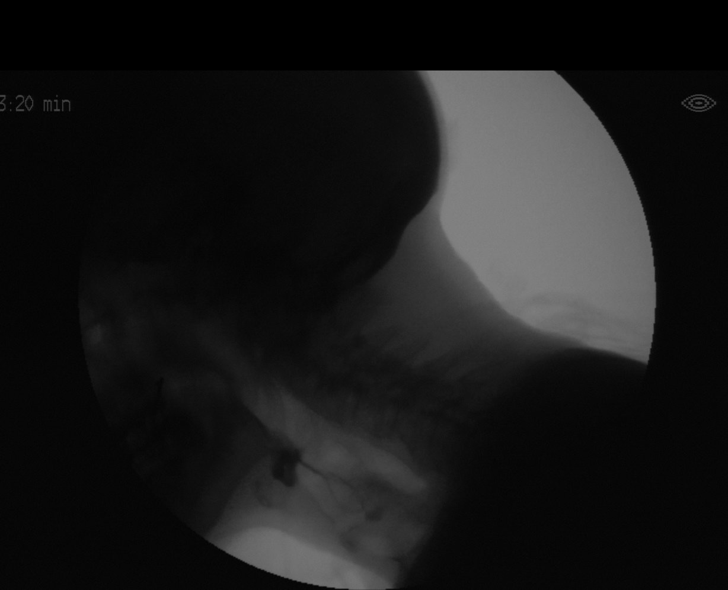

[12 of 24 positions shown; findings below may reference images not displayed]

FINDINGS: Different consistencies of barium were administered orally to the
patient by the Speech Pathologist with fluoroscopic imaging of the
pharynx, as well as limited imaging of the esophagus, from a lateral
projection. [place], Tl was present in the fluoroscopy suite
and operated the fluoroscopy equipment. Aspiration was observed with
thin and nectar consistency. A swallowed 13 mm barium tablet did not
pass beyond the level of the upper esophagus. Please refer to the
Speech Pathologist's report for full details.

Impression #4 will be called to the ordering clinician or
representative by the Radiologist Assistant, and communication
documented in the PACS or [REDACTED].
IMPRESSION: Modified barium swallow, as described.

Aspiration was observed with thin and nectar consistency.

Please refer to the Speech Pathologist's report for complete details
and recommendations.

A swallowed 13 mm barium tablet did not pass beyond the level of the
upper esophagus, and a stricture at this site cannot be excluded.
Consider a dedicated esophagram for further evaluation.
